# Patient Record
Sex: Female | Born: 1980 | Race: White | Hispanic: No | Marital: Single | Smoking: Never smoker
Health system: Southern US, Community
[De-identification: ages and names within clinical notes are randomized; demographics above are authoritative.]

## PROBLEM LIST (undated history)

## (undated) DIAGNOSIS — R87629 Unspecified abnormal cytological findings in specimens from vagina: Secondary | ICD-10-CM

## (undated) DIAGNOSIS — M1990 Unspecified osteoarthritis, unspecified site: Secondary | ICD-10-CM

## (undated) HISTORY — DX: Unspecified abnormal cytological findings in specimens from vagina: R87.629

---
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
## 2001-03-04 ENCOUNTER — Ambulatory Visit (HOSPITAL_COMMUNITY): Admission: RE | Admit: 2001-03-04 | Discharge: 2001-03-04 | Payer: Self-pay | Admitting: *Deleted

## 2001-06-13 ENCOUNTER — Inpatient Hospital Stay (HOSPITAL_COMMUNITY): Admission: AD | Admit: 2001-06-13 | Discharge: 2001-06-13 | Payer: Self-pay | Admitting: Obstetrics & Gynecology

## 2001-07-04 ENCOUNTER — Inpatient Hospital Stay (HOSPITAL_COMMUNITY): Admission: AD | Admit: 2001-07-04 | Discharge: 2001-07-07 | Payer: Self-pay | Admitting: Obstetrics & Gynecology

## 2001-12-21 ENCOUNTER — Inpatient Hospital Stay (HOSPITAL_COMMUNITY): Admission: AD | Admit: 2001-12-21 | Discharge: 2001-12-21 | Payer: Self-pay | Admitting: Obstetrics & Gynecology

## 2002-02-04 ENCOUNTER — Encounter (INDEPENDENT_AMBULATORY_CARE_PROVIDER_SITE_OTHER): Payer: Self-pay | Admitting: *Deleted

## 2002-02-04 ENCOUNTER — Inpatient Hospital Stay (HOSPITAL_COMMUNITY): Admission: AD | Admit: 2002-02-04 | Discharge: 2002-02-04 | Payer: Self-pay | Admitting: Obstetrics & Gynecology

## 2002-10-01 ENCOUNTER — Inpatient Hospital Stay (HOSPITAL_COMMUNITY): Admission: AD | Admit: 2002-10-01 | Discharge: 2002-10-01 | Payer: Self-pay | Admitting: *Deleted

## 2002-10-27 ENCOUNTER — Ambulatory Visit (HOSPITAL_COMMUNITY): Admission: RE | Admit: 2002-10-27 | Discharge: 2002-10-27 | Payer: Self-pay | Admitting: *Deleted

## 2002-12-28 ENCOUNTER — Encounter: Payer: Self-pay | Admitting: Obstetrics and Gynecology

## 2002-12-28 ENCOUNTER — Inpatient Hospital Stay (HOSPITAL_COMMUNITY): Admission: AD | Admit: 2002-12-28 | Discharge: 2002-12-28 | Payer: Self-pay | Admitting: Obstetrics and Gynecology

## 2003-02-04 ENCOUNTER — Inpatient Hospital Stay (HOSPITAL_COMMUNITY): Admission: AD | Admit: 2003-02-04 | Discharge: 2003-02-05 | Payer: Self-pay | Admitting: Family Medicine

## 2003-03-22 ENCOUNTER — Inpatient Hospital Stay (HOSPITAL_COMMUNITY): Admission: AD | Admit: 2003-03-22 | Discharge: 2003-03-22 | Payer: Self-pay | Admitting: Family Medicine

## 2003-04-04 ENCOUNTER — Inpatient Hospital Stay (HOSPITAL_COMMUNITY): Admission: AD | Admit: 2003-04-04 | Discharge: 2003-04-04 | Payer: Self-pay | Admitting: *Deleted

## 2003-04-06 ENCOUNTER — Encounter (HOSPITAL_COMMUNITY): Admission: RE | Admit: 2003-04-06 | Discharge: 2003-04-09 | Payer: Self-pay | Admitting: *Deleted

## 2003-04-08 ENCOUNTER — Inpatient Hospital Stay (HOSPITAL_COMMUNITY): Admission: AD | Admit: 2003-04-08 | Discharge: 2003-04-08 | Payer: Self-pay | Admitting: Family Medicine

## 2003-04-10 ENCOUNTER — Inpatient Hospital Stay (HOSPITAL_COMMUNITY): Admission: AD | Admit: 2003-04-10 | Discharge: 2003-04-10 | Payer: Self-pay | Admitting: *Deleted

## 2003-04-11 ENCOUNTER — Inpatient Hospital Stay (HOSPITAL_COMMUNITY): Admission: AD | Admit: 2003-04-11 | Discharge: 2003-04-13 | Payer: Self-pay | Admitting: Family Medicine

## 2005-08-28 ENCOUNTER — Emergency Department (HOSPITAL_COMMUNITY): Admission: EM | Admit: 2005-08-28 | Discharge: 2005-08-29 | Payer: Self-pay | Admitting: Emergency Medicine

## 2007-11-20 ENCOUNTER — Emergency Department (HOSPITAL_COMMUNITY): Admission: EM | Admit: 2007-11-20 | Discharge: 2007-11-21 | Payer: Self-pay | Admitting: Emergency Medicine

## 2007-11-21 ENCOUNTER — Emergency Department (HOSPITAL_COMMUNITY): Admission: EM | Admit: 2007-11-21 | Discharge: 2007-11-21 | Payer: Self-pay | Admitting: Emergency Medicine
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
## 2009-06-30 ENCOUNTER — Inpatient Hospital Stay (HOSPITAL_COMMUNITY): Admission: AD | Admit: 2009-06-30 | Discharge: 2009-06-30 | Payer: Self-pay | Admitting: Obstetrics

## 2009-08-02 ENCOUNTER — Ambulatory Visit (HOSPITAL_COMMUNITY): Admission: RE | Admit: 2009-08-02 | Discharge: 2009-08-02 | Payer: Self-pay | Admitting: Obstetrics

## 2009-10-08 ENCOUNTER — Inpatient Hospital Stay (HOSPITAL_COMMUNITY): Admission: AD | Admit: 2009-10-08 | Discharge: 2009-10-09 | Payer: Self-pay | Admitting: Obstetrics & Gynecology

## 2009-12-13 ENCOUNTER — Inpatient Hospital Stay (HOSPITAL_COMMUNITY): Admission: AD | Admit: 2009-12-13 | Discharge: 2009-12-13 | Payer: Self-pay | Admitting: Obstetrics and Gynecology

## 2009-12-18 ENCOUNTER — Inpatient Hospital Stay (HOSPITAL_COMMUNITY): Admission: AD | Admit: 2009-12-18 | Discharge: 2009-12-18 | Payer: Self-pay | Admitting: Obstetrics & Gynecology

## 2010-01-03 ENCOUNTER — Inpatient Hospital Stay (HOSPITAL_COMMUNITY): Admission: AD | Admit: 2010-01-03 | Discharge: 2010-01-06 | Payer: Self-pay | Admitting: Obstetrics & Gynecology

## 2010-02-09 ENCOUNTER — Emergency Department (HOSPITAL_COMMUNITY): Admission: EM | Admit: 2010-02-09 | Discharge: 2010-02-09 | Payer: Self-pay | Admitting: Emergency Medicine

## 2010-07-14 IMAGING — US US OB COMP +14 WK
1 series · 14 of 28 positions shown · non-contrast
Comparison: none

OBSTETRICAL ULTRASOUND:
 This ultrasound exam was performed in the [HOSPITAL] Ultrasound Department.  The OB US report was generated in the AS system, and faxed to the ordering physician.  This report is also available in [REDACTED] PACS.

[Series 1: us ob comp +14 wk · 0.27mm/px · 14 of 48 slices shown]
[im 2/48]
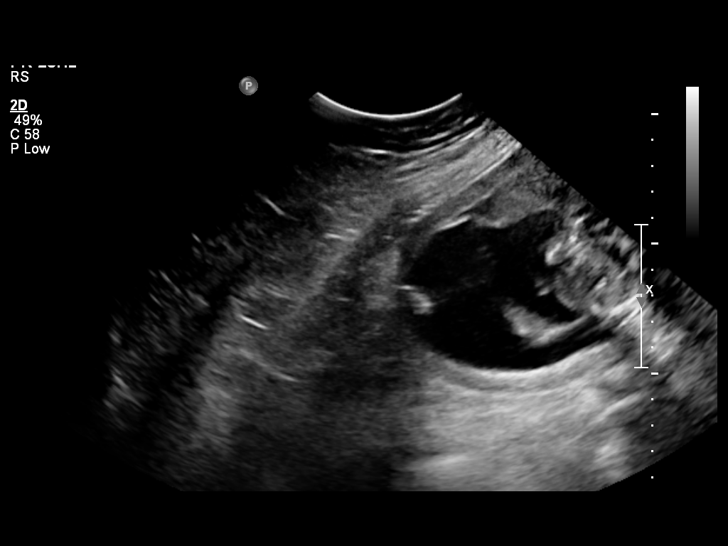
[im 6/48]
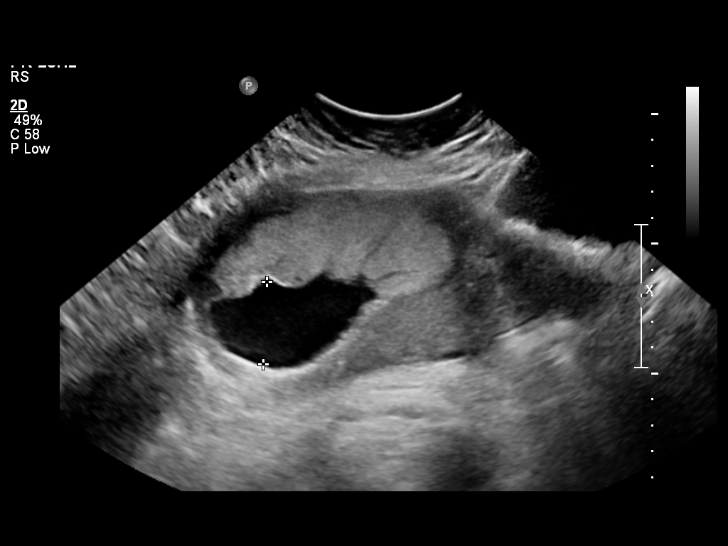
[im 9/48]
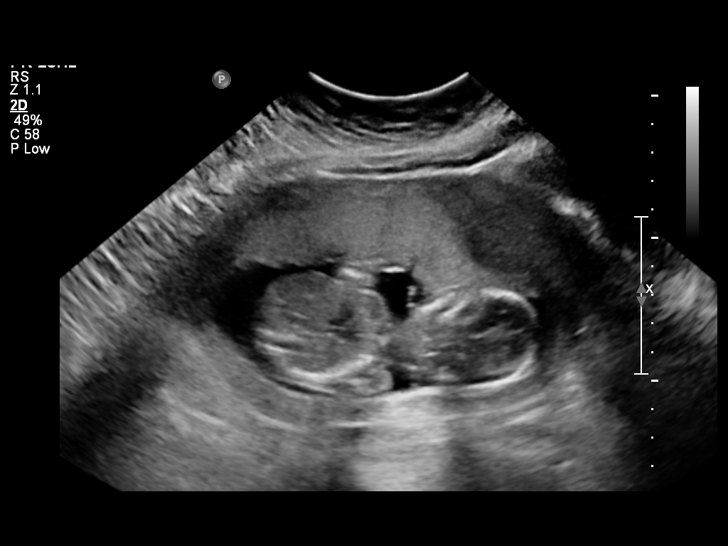
[im 13/48]
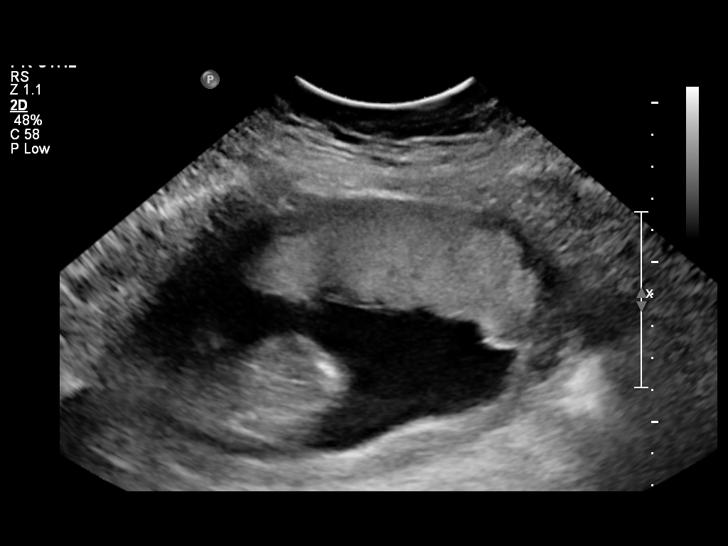
[im 16/48]
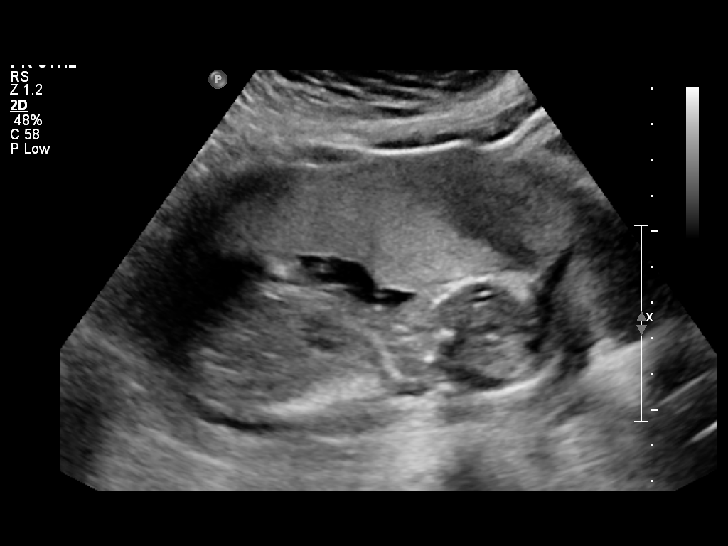
[im 20/48]
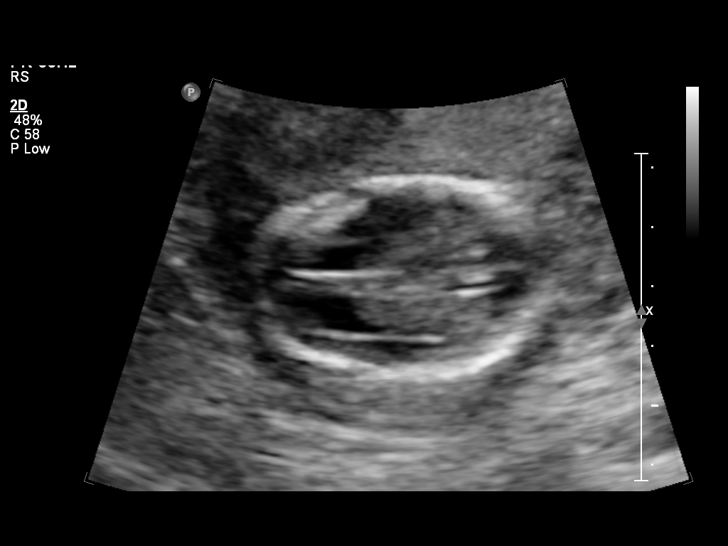
[im 23/48]
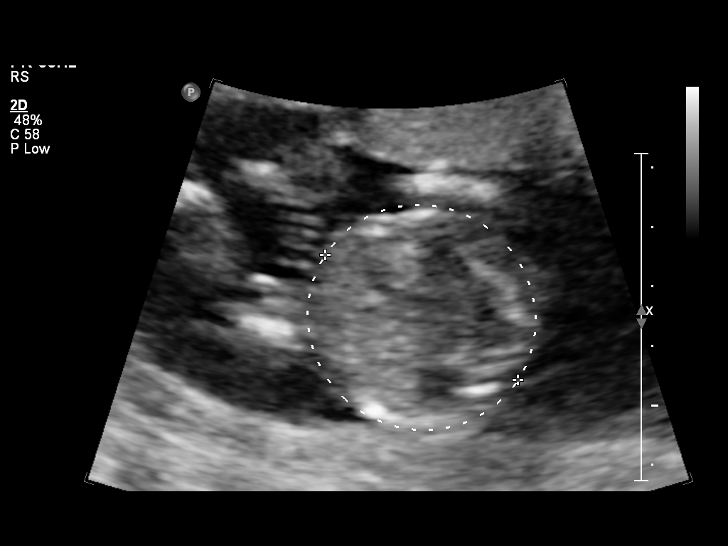
[im 27/48]
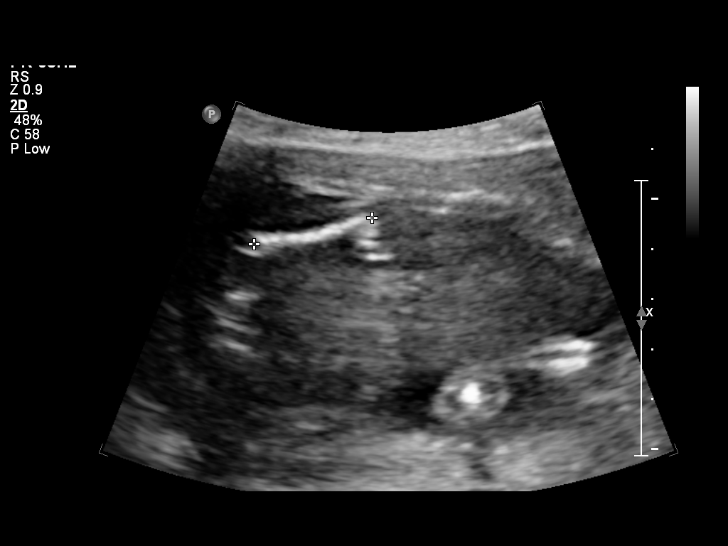
[im 30/48]
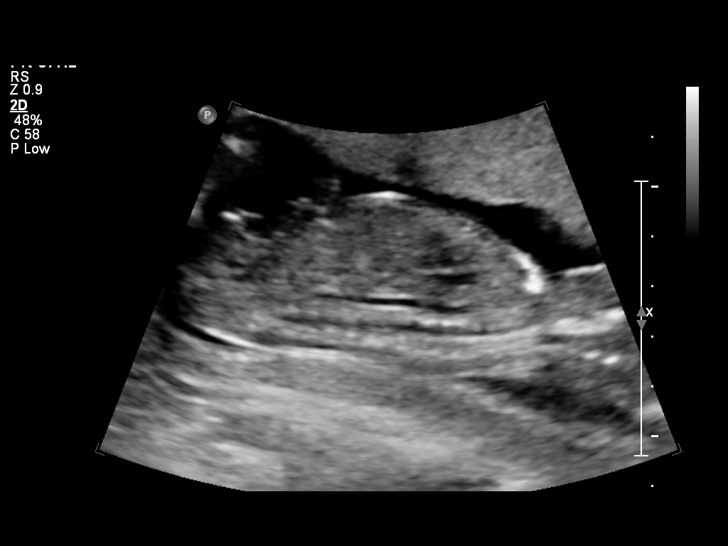
[im 34/48]
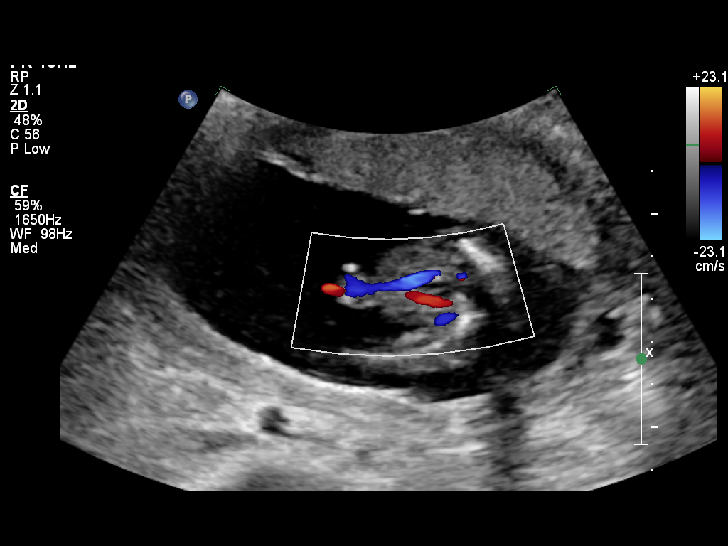
[im 37/48]
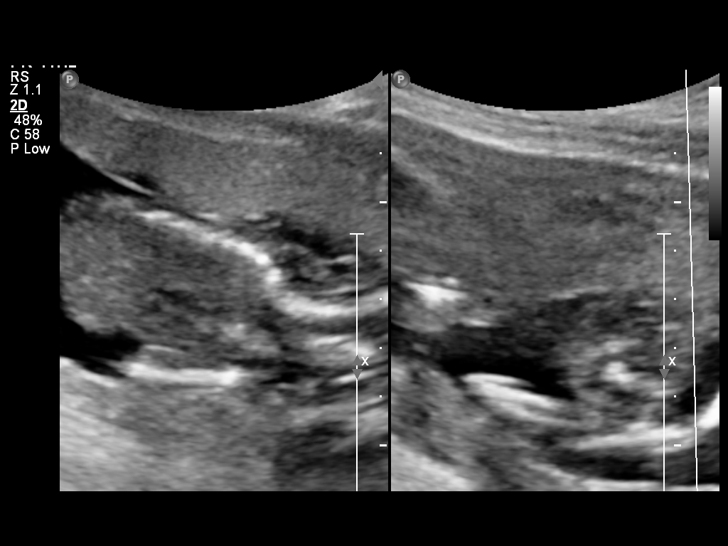
[im 41/48]
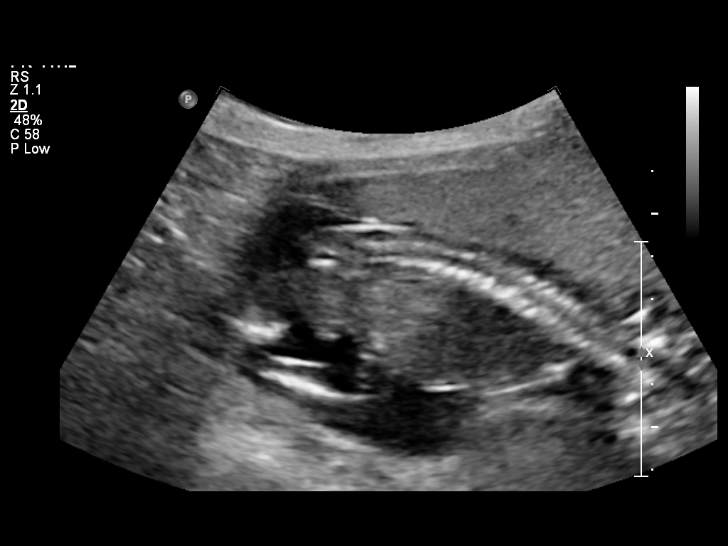
[im 44/48]
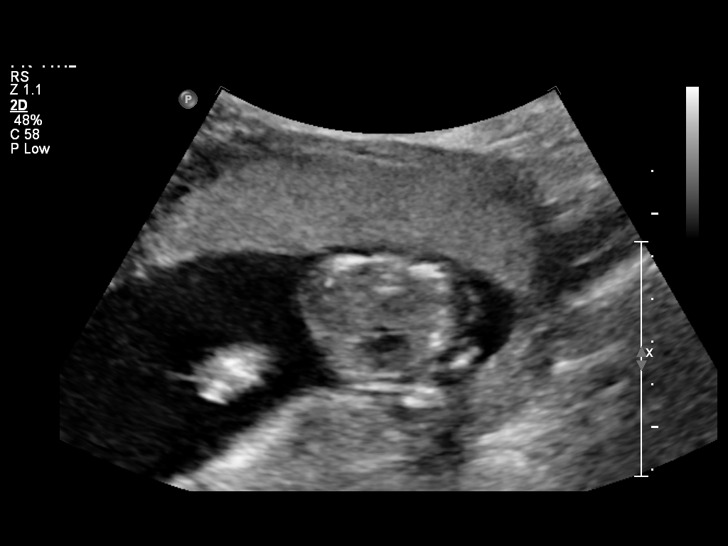
[im 48/48]
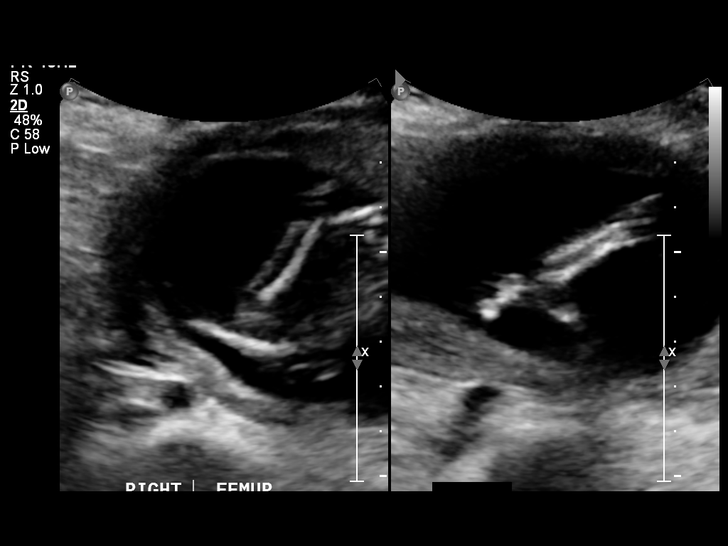

[14 of 28 positions shown; findings below may reference images not displayed]

IMPRESSION: See [facility] Obstetric US report.

## 2011-03-03 LAB — COMPREHENSIVE METABOLIC PANEL
ALT: 22 U/L (ref 0–35)
AST: 35 U/L (ref 0–37)
Albumin: 2.5 g/dL — ABNORMAL LOW (ref 3.5–5.2)
Alkaline Phosphatase: 158 U/L — ABNORMAL HIGH (ref 39–117)
BUN: 8 mg/dL (ref 6–23)
CO2: 22 mEq/L (ref 19–32)
Calcium: 9.3 mg/dL (ref 8.4–10.5)
Chloride: 107 mEq/L (ref 96–112)
Creatinine, Ser: 0.65 mg/dL (ref 0.4–1.2)
GFR calc Af Amer: >60 mL/min (ref >60)
GFR calc non Af Amer: >60 mL/min (ref >60)
Glucose, Bld: 85 mg/dL (ref 70–99)
Potassium: 3.8 mEq/L (ref 3.5–5.1)
Sodium: 135 mEq/L (ref 135–145)
Total Bilirubin: 0.2 mg/dL — ABNORMAL LOW (ref 0.3–1.2)
Total Protein: 5.7 g/dL — ABNORMAL LOW (ref 6.0–8.3)

## 2011-03-03 LAB — URIC ACID: Uric Acid, Serum: 5.6 mg/dL (ref 2.4–7.0)

## 2011-03-03 LAB — CBC
HCT: 34.5 % — ABNORMAL LOW (ref 36.0–46.0)
Hemoglobin: 11.9 g/dL — ABNORMAL LOW (ref 12.0–15.0)
MCHC: 34.4 g/dL (ref 30.0–36.0)
MCV: 91.6 fL (ref 78.0–100.0)
Platelets: 204 10*3/uL (ref 150–400)
RBC: 3.76 MIL/uL — ABNORMAL LOW (ref 3.87–5.11)
RDW: 14.5 % (ref 11.5–15.5)
WBC: 8 10*3/uL (ref 4.0–10.5)

## 2011-03-03 LAB — URINALYSIS, DIPSTICK ONLY
Glucose, UA: NEGATIVE mg/dL
Hgb urine dipstick: NEGATIVE
Leukocytes, UA: NEGATIVE
pH: 6.5 (ref 5.0–8.0)

## 2011-03-03 LAB — LACTATE DEHYDROGENASE: LDH: 174 U/L (ref 94–250)

## 2011-03-04 LAB — LACTATE DEHYDROGENASE
LDH: 208 U/L (ref 94–250)
LDH: 209 U/L (ref 94–250)

## 2011-03-04 LAB — CBC
HCT: 30.4 % — ABNORMAL LOW (ref 36.0–46.0)
HCT: 38.4 % (ref 36.0–46.0)
Hemoglobin: 10.8 g/dL — ABNORMAL LOW (ref 12.0–15.0)
Hemoglobin: 13 g/dL (ref 12.0–15.0)
MCHC: 34 g/dL (ref 30.0–36.0)
MCV: 92 fL (ref 78.0–100.0)
Platelets: 169 10*3/uL (ref 150–400)
Platelets: 171 10*3/uL (ref 150–400)
RDW: 15.3 % (ref 11.5–15.5)
WBC: 11.8 10*3/uL — ABNORMAL HIGH (ref 4.0–10.5)
WBC: 9.7 10*3/uL (ref 4.0–10.5)

## 2011-03-04 LAB — COMPREHENSIVE METABOLIC PANEL
ALT: 24 U/L (ref 0–35)
Albumin: 2 g/dL — ABNORMAL LOW (ref 3.5–5.2)
Alkaline Phosphatase: 222 U/L — ABNORMAL HIGH (ref 39–117)
BUN: 13 mg/dL (ref 6–23)
CO2: 21 mEq/L (ref 19–32)
Calcium: 7.9 mg/dL — ABNORMAL LOW (ref 8.4–10.5)
Chloride: 108 mEq/L (ref 96–112)
Creatinine, Ser: 0.79 mg/dL (ref 0.4–1.2)
GFR calc Af Amer: >60 mL/min (ref >60)
GFR calc non Af Amer: >60 mL/min (ref >60)
Glucose, Bld: 105 mg/dL — ABNORMAL HIGH (ref 70–99)
Glucose, Bld: 76 mg/dL (ref 70–99)
Potassium: 4.3 mEq/L (ref 3.5–5.1)
Sodium: 136 mEq/L (ref 135–145)
Total Bilirubin: 0.5 mg/dL (ref 0.3–1.2)
Total Protein: 5 g/dL — ABNORMAL LOW (ref 6.0–8.3)

## 2011-03-04 LAB — PROTEIN, URINE, RANDOM: Total Protein, Urine: 36 mg/dL

## 2011-03-04 LAB — MAGNESIUM: Magnesium: 6.7 mg/dL (ref 1.5–2.5)

## 2011-03-04 LAB — RPR: RPR Ser Ql: NONREACTIVE

## 2011-03-19 LAB — URIC ACID: Uric Acid, Serum: 5.7 mg/dL (ref 2.4–7.0)

## 2011-03-19 LAB — URINALYSIS, ROUTINE W REFLEX MICROSCOPIC
Leukocytes, UA: NEGATIVE
Nitrite: NEGATIVE
Specific Gravity, Urine: 1.015 (ref 1.005–1.030)
pH: 6.5 (ref 5.0–8.0)

## 2011-03-19 LAB — COMPREHENSIVE METABOLIC PANEL
ALT: 23 U/L (ref 0–35)
Albumin: 2.6 g/dL — ABNORMAL LOW (ref 3.5–5.2)
Alkaline Phosphatase: 165 U/L — ABNORMAL HIGH (ref 39–117)
Potassium: 3.9 mEq/L (ref 3.5–5.1)
Sodium: 135 mEq/L (ref 135–145)
Total Protein: 6.2 g/dL (ref 6.0–8.3)

## 2011-03-19 LAB — URINE MICROSCOPIC-ADD ON

## 2011-03-19 LAB — CBC
Platelets: 230 10*3/uL (ref 150–400)
RDW: 14.2 % (ref 11.5–15.5)

## 2011-05-04 NOTE — Discharge Summary (Signed)
   NAME:  Cindy Galloway, Cindy Galloway                           ACCOUNT NO.:  [id number]   MEDICAL RECORD NO.:  [id number]                   PATIENT TYPE:  INP   LOCATION:  9325                                 FACILITY:  [facility]   PHYSICIAN:  Nani Gasser, M.D.            DATE OF BIRTH:  09/19/1981   DATE OF ADMISSION:  02/04/2003  DATE OF DISCHARGE:  02/05/2003                                 DISCHARGE SUMMARY   DISCHARGE DIAGNOSES:  1. Gastroenteritis.  2. Pregnancy.  3. Dehydration.   DISCHARGE MEDICATIONS:  Phenergan 12.5 mg one p.o. q.6h. p.r.n. #20.   DIET AT HOME:  The patient should begin with soft foods and liquids and  advance diet as tolerated.   FOLLOW-UP:  The patient is to keep her next appointment at [facility]  and was given information on preterm labor.   DISPOSITION:  The patient was discharged to home.   HOSPITAL COURSE:  The patient is a 30 year old G4 P1-0-2-1 who presented at  30 and five-sevenths weeks baseline LMP.  The patient's main complaint was  vomiting and diarrhea since 7 p.m. the previous evening.  She felt dizzy and  complained of cramps; also complained of calf cramps x1 weeks.  She also  felt short of breath today with exertion and has a history of reflux and  recently had a bout of pneumonia during November 2003.  The patient was  admitted for persistent vomiting and dehydration.  The patient was bolused  with IV fluids and put on maintenance. She was given Phenergan for the  nausea.  Her electrolytes were within normal limits:  Sodium 135, potassium  3.7, chloride 106, bicarb 21, BUN 8, creatinine 0.7, and a glucose of 100.  Her urinalysis showed ketones greater than 80 and a specific gravity of  1.030.  The patient did well over several hours and was able to keep down a  modified liquid diet for lunch, and she was discharged to home with  prescription for Phenergan.                                               Nani Gasser, M.D.    CM/MEDQ  D:  02/05/2003  T:  02/05/2003  Job:  [id number]

## 2011-05-04 NOTE — Op Note (Signed)
[facility]  Patient:    Cindy Galloway, Cindy Galloway                        MRN: [id number] Proc. Date: 07/04/01 Adm. Date:  [id number] Disc. Date: [id number] Attending:  Antionette Char                           Operative Report  INCOMPLETE  PREOPERATIVE DIAGNOSES:       1. Term intrauterine pregnancy, breach                                  presentation.                               2. Oligohydramnios.   POSTOPERATIVE DIAGNOSES:      1. Term intrauterine pregnancy, breach                                  presentation.                               2. Oligohydramnios.  OPERATION:                    Primary low transverse cesarean section delivery via Pfannenstiel skin incision.  SURGEON:                      Roseanna Rainbow, M.D.                               Charles A. Clearance Coots, M.D.  ANESTHESIA:                   Spinal.  ESTIMATED BLOOD LOSS:         800 cc.  COMPLICATIONS:                None.  FLUIDS AND URINE OUTPUT:      As per anesthesiology.  INDICATIONS:                  The patient is an 30 year old DD:  07/07/01 TD:  07/08/01 Job: [id number]

## 2011-05-04 NOTE — Discharge Summary (Signed)
[facility]  Patient:    Cindy Galloway, Cindy Galloway                        MRN: [id number] Adm. Date:  [id number] Disc. Date: 07/07/01 Attending:  Antionette Char Dictator:   Jonah Blue, M.D.                           Discharge Summary  DATE OF BIRTH:                [birth year]  ADMISSION DIAGNOSES:          Scheduled for routine cesarean section delivery today due to increased risk with breech presentation and contractions at this time.  DISCHARGE DIAGNOSES:          Status post low transverse cesarean section, status post delivery of a viable female infant.  REFERRING PHYSICIAN:          [facility].  CONSULTS:                     None.  PROCEDURE:                    Low transverse cesarean section on July 04, 2001.  HISTORY AND PHYSICAL:         An 30 year old G2, P0-0-1-0 at 85 and 5 weeks presented with contractions and frank breech presentation.  On admission patient was sent for routine antenatal testing and was frank breech on ultrasound and had been noticing contractions x 1 week, increasing in intensity over the last three days.  Membranes were intact at that time.  No vaginal bleeding.  Positive fetal movement.  [facility] with onset at 93 and 3 weeks.  Pregnancy complications were elevated BPs, breech presentation, and anemia.  MEDICATIONS:                  Prenatal vitamins, iron, Tylenol, Tums.  ALLERGIES:                    No known drug allergies.  PAST OBSTETRICAL HISTORY:     G2, previous TAB at 8 weeks.  PAST MEDICAL HISTORY:         History of GERD, history of kidney stones, TAB at 8 weeks.  PAST GYNECOLOGICAL HISTORY:   None.  FAMILY HISTORY:               Noncontributory.  SOCIAL HISTORY:               Noncontributory.  PRENATAL LABORATORIES:        A+.  Antibody negative.  Hemoglobin 12.4, platelets 339,000.  Rubella immune.  Hepatitis B surface antigen  negative. RPR negative.  HIV negative.  GC and chlamydia negative.  GBS negative on March 31, 2001.  ______ ultrasound at 21 and 5 weeks.  PHYSICAL EXAMINATION  GENERAL:                      Patient in no acute distress.  HEART:                        Regular rate and rhythm.  LUNGS:                        Clear to auscultation  bilaterally.  ABDOMEN:                      Gravid, positive bowel sounds.  EXTREMITIES:                  Pulses 2+.  No edema.  LABORATORIES:                 Fetal heart rate in the 140s with positive reactivity and variability, mild variable decelerations.  Contraction frequency 7-11 minutes lasting approximately 30 seconds.  Ultrasound showed breech presentation with oligohydramnios.  ASSESSMENT:                   An 30 year old G2, P0 at 68 and 5 with frank breech presentation and uterine contractions and oligohydramnios scheduled for elective cesarean section delivery due to increased risk with breech presentation and contractions at this time.  HOSPITAL COURSE:              Patient was taken to the OR on July 04, 2001 at 1600 with preoperative diagnoses of IUP at term, breech presentation, and oligohydramnios.  Postoperative diagnoses the same.  Delivered a live female infant from frank breech presentation with Apgars 8/1 and 9/5.  Delivered at 1632.  Patient did well postoperative.  Began breast-feeding without difficulty.  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  Patients hospital course was normal without complications.  Patients hemoglobin and hematocrit on July 05, 2001 were 10.3 and 29.6.  CONDITION ON DISCHARGE:       Good.  DISPOSITION:                  Patient discharged to home with infant.  DISCHARGE MEDICATIONS:        1. Ibuprofen 600 mg p.o. q.6h. p.r.n. pain.                               2. Percocet 5/325 one tablet p.o. q.6h. p.r.n.                                   severe pain.                               3. Prenatal vitamins one p.o. q.d.  INSTRUCTIONS:                 Routine activity, diet, dressing, and pelvic rest instructions were given to patient.  [facility] DD:  07/07/01 TD:  07/07/01 Job: [id number]

## 2012-04-28 ENCOUNTER — Emergency Department (HOSPITAL_COMMUNITY)
Admission: EM | Admit: 2012-04-28 | Discharge: 2012-04-28 | Disposition: A | Discharge status: Home / Self Care | Payer: Self-pay

## 2012-04-29 ENCOUNTER — Emergency Department (HOSPITAL_COMMUNITY): Payer: No Typology Code available for payment source

## 2012-04-29 ENCOUNTER — Emergency Department (HOSPITAL_COMMUNITY)
Admission: EM | Admit: 2012-04-29 | Discharge: 2012-04-29 | Disposition: A | Discharge status: Home / Self Care | Payer: No Typology Code available for payment source | Attending: Emergency Medicine | Admitting: Emergency Medicine

## 2012-04-29 ENCOUNTER — Encounter (HOSPITAL_COMMUNITY): Payer: Self-pay | Admitting: *Deleted

## 2012-04-29 DIAGNOSIS — Y998 Other external cause status: Secondary | ICD-10-CM | POA: Insufficient documentation

## 2012-04-29 DIAGNOSIS — M546 Pain in thoracic spine: Secondary | ICD-10-CM | POA: Insufficient documentation

## 2012-04-29 DIAGNOSIS — M25519 Pain in unspecified shoulder: Secondary | ICD-10-CM | POA: Insufficient documentation

## 2012-04-29 DIAGNOSIS — T148XXA Other injury of unspecified body region, initial encounter: Secondary | ICD-10-CM

## 2012-04-29 DIAGNOSIS — M25579 Pain in unspecified ankle and joints of unspecified foot: Secondary | ICD-10-CM | POA: Insufficient documentation

## 2012-04-29 DIAGNOSIS — M1990 Unspecified osteoarthritis, unspecified site: Secondary | ICD-10-CM | POA: Insufficient documentation

## 2012-04-29 DIAGNOSIS — M542 Cervicalgia: Secondary | ICD-10-CM | POA: Insufficient documentation

## 2012-04-29 DIAGNOSIS — Y92410 Unspecified street and highway as the place of occurrence of the external cause: Secondary | ICD-10-CM | POA: Insufficient documentation

## 2012-04-29 DIAGNOSIS — M25539 Pain in unspecified wrist: Secondary | ICD-10-CM | POA: Insufficient documentation

## 2012-04-29 HISTORY — DX: Unspecified osteoarthritis, unspecified site: M19.90

## 2012-04-29 MED ORDER — IBUPROFEN 800 MG PO TABS
800.0000 mg | ORAL_TABLET | Freq: Once | ORAL | Status: AC
  Administered 2012-04-29: 800 mg via ORAL
  Filled 2012-04-29: qty 1

## 2012-04-29 MED ORDER — HYDROCODONE-ACETAMINOPHEN 5-325 MG PO TABS: 1.0000 | ORAL_TABLET | ORAL | Status: AC | PRN

## 2012-04-29 MED ORDER — CYCLOBENZAPRINE HCL 10 MG PO TABS: 10.0000 mg | ORAL_TABLET | Freq: Two times a day (BID) | ORAL | Status: AC | PRN

## 2012-04-29 MED ORDER — IBUPROFEN 800 MG PO TABS: 800.0000 mg | ORAL_TABLET | Freq: Three times a day (TID) | ORAL | Status: AC
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
## 2012-04-29 NOTE — ED Notes (Addendum)
Documentation on wrong pt.
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
## 2012-04-30 NOTE — ED Provider Notes (Signed)
Medical screening examination/treatment/procedure(s) were performed by non-physician practitioner and as supervising physician I was immediately available for consultation/collaboration.  Jerilyn Gillaspie R. Chee Dimon, MD 04/30/12 1536 

## 2014-10-06 ENCOUNTER — Encounter (HOSPITAL_COMMUNITY): Payer: Self-pay | Admitting: Emergency Medicine

## 2014-10-06 ENCOUNTER — Emergency Department (HOSPITAL_COMMUNITY)
Admission: EM | Admit: 2014-10-06 | Discharge: 2014-10-06 | Disposition: A | Discharge status: Home / Self Care | Payer: BC Managed Care – PPO | Attending: Emergency Medicine | Admitting: Emergency Medicine

## 2014-10-06 DIAGNOSIS — Z791 Long term (current) use of non-steroidal anti-inflammatories (NSAID): Secondary | ICD-10-CM | POA: Insufficient documentation

## 2014-10-06 DIAGNOSIS — M1990 Unspecified osteoarthritis, unspecified site: Secondary | ICD-10-CM | POA: Insufficient documentation

## 2014-10-06 DIAGNOSIS — B029 Zoster without complications: Secondary | ICD-10-CM

## 2014-10-06 MED ORDER — ACYCLOVIR 800 MG PO TABS: 800.0000 mg | ORAL_TABLET | Freq: Every day | ORAL | Status: DC

## 2014-10-06 MED ORDER — HYDROCODONE-ACETAMINOPHEN 5-325 MG PO TABS: 1.0000 | ORAL_TABLET | ORAL | Status: DC | PRN

## 2014-10-06 MED ORDER — GABAPENTIN 100 MG PO CAPS: 100.0000 mg | ORAL_CAPSULE | Freq: Three times a day (TID) | ORAL | Status: DC

## 2014-10-06 NOTE — ED Notes (Addendum)
Pt c/o burning rash starting in middle of back and coming around to front breast.  C/o burning pain.  Has had chicken pox and shingles before.  None of the sores are open.

## 2014-10-06 NOTE — Discharge Instructions (Signed)

## 2014-10-06 NOTE — ED Provider Notes (Signed)
CSN: [id number]     Arrival date & time 10/06/14  [id number] History   First MD Initiated Contact with Patient 10/06/14 1009     Chief Complaint  Patient presents with  . Rash  . Shingles?      (Consider location/radiation/quality/duration/timing/severity/associated sxs/prior Treatment) HPI Pt is a 33yo female presenting to ED with c/o of a rash that is constant burning itching, gradually worsening, and spreading since Saturday, 10/17.  Rash started on right side of mid-back between her shoulder blades but is now wrapping around under her right arm to her right breast.  Pt states she initially thought it was a spider bite and felt a "numbness" type feeling but it has since turned into a burning sensation, 7/10 at worst. Worse with touch.  Hx of chickenpox and shingles before and believes this may be shingles again, however, states the first time was "many years ago" at that time she was prescribed a cream to apply to rash and it eventually went away.  That rash was on her left shoulder.  Denies fever, chills, n/v/d.     Past Medical History  Diagnosis Date  . DJD (degenerative joint disease)    Past Surgical History  Procedure Laterality Date  . Cesarean section     No family history on file. History  Substance Use Topics  . Smoking status: Never Smoker   . Smokeless tobacco: Not on file  . Alcohol Use: Yes     Comment: occasionally   OB History   Grav Para Term Preterm Abortions TAB SAB Ect Mult Living                 Review of Systems  Constitutional: Negative for fever and chills.  Respiratory: Negative for cough and shortness of breath.   Cardiovascular: Negative for chest pain and palpitations.  Gastrointestinal: Negative for nausea, vomiting and diarrhea.  Musculoskeletal: Positive for myalgias ( along area of rash). Negative for arthralgias.  Skin: Positive for rash and wound. Negative for color change.  All other systems reviewed and are negative.     Allergies   Review of patient's allergies indicates no known allergies.  Home Medications   Prior to Admission medications   Medication Sig Start Date End Date Taking? Authorizing Provider  naproxen sodium (ANAPROX) 220 MG tablet Take 440 mg by mouth 2 (two) times daily with a meal.   Yes Historical Provider, MD  acyclovir (ZOVIRAX) 800 MG tablet Take 1 tablet (800 mg total) by mouth 5 (five) times daily. 10/06/14   Junius FinnerErin O'Malley, PA-C  gabapentin (NEURONTIN) 100 MG capsule Take 1 capsule (100 mg total) by mouth 3 (three) times daily. 10/06/14   Junius FinnerErin O'Malley, PA-C  HYDROcodone-acetaminophen (NORCO/VICODIN) 5-325 MG per tablet Take 1-2 tablets by mouth every 4 (four) hours as needed for moderate pain or severe pain. 10/06/14   Junius FinnerErin O'Malley, PA-C   BP 124/73  Pulse 68  Temp(Src) 97.8 F (36.6 C) (Oral)  Resp 18  SpO2 100% Physical Exam  Nursing note and vitals reviewed. Constitutional: She is oriented to person, place, and time. She appears well-developed and well-nourished.  Pt appears well, non-toxic.  HENT:  Head: Normocephalic and atraumatic.  Eyes: EOM are normal.  Neck: Normal range of motion.  Cardiovascular: Normal rate.   Pulmonary/Chest: Effort normal.  Musculoskeletal: Normal range of motion.  Neurological: She is alert and oriented to person, place, and time.  Skin: Skin is warm and dry. Rash noted. There is erythema.  Rash along T4-T6  dermatome on right side of back and right axilla.  Erythematous papular rash with confluent vesicles. No active discharge. No bleeding. Tender to touch. No evidence of underlying infection  Psychiatric: She has a normal mood and affect. Her behavior is normal.    ED Course  Procedures (including critical care time) Labs Review Labs Reviewed - No data to display  Imaging Review No results found.   EKG Interpretation None      MDM   Final diagnoses:  Shingles rash    Pt presenting to ED with rash consistent with shingles.  Rash does  not have any open sores or signs of underlying infection. Will discharge home with acyclovir, neurontin, and norco.  Advised to f/u with PCP in 1 week for recheck of symptoms if not improving. Home care instructions provided. Return precautions provided. Pt verbalized understanding and agreement with tx plan.      Junius Finnerrin O'Malley, PA-C 10/06/14 1039

## 2014-10-11 NOTE — ED Provider Notes (Signed)
Medical screening examination/treatment/procedure(s) were performed by non-physician practitioner and as supervising physician I was immediately available for consultation/collaboration.   EKG Interpretation None       Kaisyn Millea, MD 10/11/14 1548 

## 2017-02-18 ENCOUNTER — Encounter: Payer: Self-pay | Admitting: General Practice

## 2017-03-04 ENCOUNTER — Encounter: Payer: Self-pay | Admitting: Obstetrics & Gynecology

## 2017-03-04 ENCOUNTER — Ambulatory Visit (INDEPENDENT_AMBULATORY_CARE_PROVIDER_SITE_OTHER): Payer: Medicaid Other | Admitting: Obstetrics & Gynecology

## 2017-03-04 VITALS — BP 119/61 | HR 77 | Ht 60.0 in | Wt 191.0 lb

## 2017-03-04 DIAGNOSIS — Z30430 Encounter for insertion of intrauterine contraceptive device: Secondary | ICD-10-CM

## 2017-03-04 LAB — POCT PREGNANCY, URINE: Preg Test, Ur: NEGATIVE

## 2017-03-04 MED ORDER — PARAGARD INTRAUTERINE COPPER IU IUD: 1.0000 [IU] | INTRAUTERINE_SYSTEM | Freq: Once | INTRAUTERINE | Status: DC

## 2017-03-04 MED ORDER — PARAGARD INTRAUTERINE COPPER IU IUD
1.0000 | INTRAUTERINE_SYSTEM | Freq: Once | INTRAUTERINE | Status: AC
Start: 2017-03-04 — End: 2017-03-04
  Administered 2017-03-04: 1 via INTRAUTERINE

## 2017-03-04 NOTE — Progress Notes (Signed)
    GYNECOLOGY OFFICE PROCEDURE NOTE  [name] is a 36 y.o. [phone] here for contraception management. [facility] had normal annual exam and normal pap smear there last month; awaiting records from them. She was sent here to discuss BTL but upon review of possible surgical risks, benefits, alternatives; she opted for Paragard IUD instead wasLiletta IUD insertion. No GYN concerns.    IUD Insertion Procedure Note Patient identified, informed consent performed, consent signed.   Discussed risks of irregular bleeding, cramping, infection, malpositioning or misplacement of the IUD outside the uterus which may require further procedure such as laparoscopy. Time out was performed.  Urine pregnancy test negative.  Speculum placed in the vagina.  Cervix visualized.  Cleaned with Betadine x 2.  Grasped anteriorly with a single tooth tenaculum.   Paragard IUD placed per manufacturer's recommendations.  Strings trimmed to 3 cm. Tenaculum was removed, good hemostasis noted.  Patient tolerated procedure well.   Patient was given post-procedure instructions.  She was advised to have backup contraception for one week.  Patient was also asked to check IUD strings periodically and follow up in 4 weeks for IUD check.   Jaynie CollinsUGONNA  ANYANWU, MD, FACOG Attending Obstetrician & Gynecologist, [facility], [facility]

## 2017-03-04 NOTE — Patient Instructions (Signed)

## 2017-03-08 ENCOUNTER — Encounter: Payer: Self-pay | Admitting: *Deleted

## 2017-03-31 ENCOUNTER — Emergency Department (HOSPITAL_COMMUNITY)
Admission: EM | Admit: 2017-03-31 | Discharge: 2017-03-31 | Disposition: A | Discharge status: Home / Self Care | Payer: Medicaid Other | Attending: Emergency Medicine | Admitting: Emergency Medicine

## 2017-03-31 ENCOUNTER — Encounter (HOSPITAL_COMMUNITY): Payer: Self-pay | Admitting: Emergency Medicine

## 2017-03-31 DIAGNOSIS — K0889 Other specified disorders of teeth and supporting structures: Secondary | ICD-10-CM | POA: Diagnosis present

## 2017-03-31 MED ORDER — PENICILLIN V POTASSIUM 500 MG PO TABS: 500.0000 mg | ORAL_TABLET | Freq: Four times a day (QID) | ORAL | 0 refills | Status: AC

## 2017-03-31 MED ORDER — NAPROXEN 500 MG PO TABS: 500.0000 mg | ORAL_TABLET | Freq: Two times a day (BID) | ORAL | 0 refills | Status: AC

## 2017-03-31 NOTE — ED Provider Notes (Signed)
[facility]-EMERGENCY DEPT Provider Note   CSN: [id number] Arrival date & time: 03/31/17  1839   By signing my name below, I, Soijett Blue, attest that this documentation has been prepared under the direction and in the presence of Will Bianka Liberati, PA-C Electronically Signed: Soijett Blue, ED Scribe. 03/31/17. 7:59 PM.  History   Chief Complaint Chief Complaint  Patient presents with  . Dental Pain    HPI Cindy Galloway is a 36 y.o. female who presents to the Emergency Department complaining of left lower dental pain onset this morning. Pt reports associated left sided facial pain, and left jaw pain. Pt has tried ibuprofen with no relief of her symptoms. Denies having a dentist at this time, but notes that she has been evaluated by a dentist in the past. Denies fever, drainage, appetite change, trouble swallowing, and any other symptoms. Denies allergies to medications.     The history is provided by the patient. No language interpreter was used.    Past Medical History:  Diagnosis Date  . DJD (degenerative joint disease)   . Vaginal Pap smear, abnormal     There are no active problems to display for this patient.   Past Surgical History:  Procedure Laterality Date  . CESAREAN SECTION      OB History    Gravida Para Term Preterm AB Living   SAB TAB Ectopic Multiple Live Births     2     3       Home Medications    Prior to Admission medications   Medication Sig Start Date End Date Taking? Authorizing Provider  naproxen (NAPROSYN) 500 MG tablet Take 1 tablet (500 mg total) by mouth 2 (two) times daily with a meal. 03/31/17   Everlene Farrier, PA-C  penicillin v potassium (VEETID) 500 MG tablet Take 1 tablet (500 mg total) by mouth 4 (four) times daily. 03/31/17   Everlene Farrier, PA-C    Family History Family History  Problem Relation Age of Onset  . Diabetes Father   . Cirrhosis Father   . Liver cancer Mother     Social History Social History  Substance  Use Topics  . Smoking status: Never Smoker  . Smokeless tobacco: Never Used  . Alcohol use Yes     Comment: occasionally     Allergies   Patient has no known allergies.   Review of Systems Review of Systems  Constitutional: Positive for chills. Negative for appetite change and fever.  HENT: Positive for dental problem (left lower back). Negative for facial swelling, sore throat, trouble swallowing and voice change.        +Left sided facial pain. + Left jaw pain  Eyes: Negative for visual disturbance.  Neurological: Negative for headaches.     Physical Exam Updated Vital Signs BP (!) 142/82   Pulse 96   Temp 98.9 F (37.2 C)   Resp 18   Ht 5' (1.524 m)   Wt 83.9 kg   LMP 03/07/2017   SpO2 98%   BMI 36.13 kg/m   Physical Exam  Constitutional: She is oriented to person, place, and time. She appears well-developed and well-nourished. No distress.  Non-toxic appearing.   HENT:  Head: Normocephalic and atraumatic.  Right Ear: External ear normal.  Left Ear: External ear normal.  Mouth/Throat: Uvula is midline, oropharynx is clear and moist and mucous membranes are normal. No trismus in the jaw. No dental abscesses. No oropharyngeal exudate.  Tenderness to left back molar. No fluctuance, induration or abscess.  No discharge from the mouth. No facial swelling.  Uvula is midline without edema. Soft palate rises symmetrically. No tonsillar hypertrophy or exudates. Tongue protrusion is normal. No trismus. No PTA.  Bilateral tympanic membranes are pearly-gray without erythema or loss of landmarks.   Eyes: Conjunctivae are normal. Pupils are equal, round, and reactive to light. Right eye exhibits no discharge. Left eye exhibits no discharge.  Neck: Normal range of motion. Neck supple. No JVD present. No tracheal deviation present.  Cardiovascular: Normal rate and intact distal pulses.   Pulmonary/Chest: Effort normal. No respiratory distress.  Lymphadenopathy:    She has no  cervical adenopathy.  Neurological: She is alert and oriented to person, place, and time. No cranial nerve deficit. Coordination normal.  Skin: Skin is warm and dry. Capillary refill takes less than 2 seconds. No rash noted. She is not diaphoretic. No erythema. No pallor.  Psychiatric: She has a normal mood and affect. Her behavior is normal.  Nursing note and vitals reviewed.    ED Treatments / Results  DIAGNOSTIC STUDIES: Oxygen Saturation is 98% on RA, nl by my interpretation.    COORDINATION OF CARE: 7:58 PM Discussed treatment plan with pt at bedside which includes penicillin VK Rx, naprosyn Rx and pt agreed to plan.  Procedures Procedures (including critical care time)  Medications Ordered in ED Medications - No data to display   Initial Impression / Assessment and Plan / ED Course  I have reviewed the triage vital signs and the nursing notes.   This is a 36 y.o. female who presents to the Emergency Department complaining of left lower dental pain onset this morning.  Patient with dentalgia.  No abscess requiring immediate incision and drainage.  Exam not concerning for Ludwig's angina or pharyngeal abscess.  Will treat with penicillin V K and naprosyn Rx. Pt instructed to follow-up with dentist.  Dental resource guide provided. Discussed return precautions. I advised the patient to follow-up with their primary care provider this week. I advised the patient to return to the emergency department with new or worsening symptoms or new concerns. The patient verbalized understanding and agreement with plan.    Final Clinical Impressions(s) / ED Diagnoses   Final diagnoses:  Pain, dental    New Prescriptions New Prescriptions   NAPROXEN (NAPROSYN) 500 MG TABLET    Take 1 tablet (500 mg total) by mouth 2 (two) times daily with a meal.   PENICILLIN V POTASSIUM (VEETID) 500 MG TABLET    Take 1 tablet (500 mg total) by mouth 4 (four) times daily.   I personally performed the  services described in this documentation, which was scribed in my presence. The recorded information has been reviewed and is accurate.       Everlene Farrier, PA-C 03/31/17 2003    Azalia Bilis, MD 03/31/17 2006

## 2017-03-31 NOTE — ED Triage Notes (Signed)
Pt c/o left lower toothache that increased when she woke up today. Pt reports that she had a wisdom tooth growing in.

## 2017-04-04 ENCOUNTER — Ambulatory Visit: Payer: Medicaid Other | Admitting: Obstetrics & Gynecology

## 2017-04-04 ENCOUNTER — Encounter: Payer: Self-pay | Admitting: *Deleted

## 2017-04-04 NOTE — Progress Notes (Signed)
Pt did not keep appointment today. Per Dr. Anywanwu no need to call patient, if she calls she may reschedule if desired.  

## 2020-09-12 ENCOUNTER — Telehealth: Payer: Self-pay | Admitting: Genetic Counselor

## 2020-09-12 NOTE — Telephone Encounter (Signed)
Received a genetic counseling referral from [facility] for fhx of breast cancer. Pt has been scheduled for the pt to see Irving Burton on 10/19

## 2020-09-12 NOTE — Telephone Encounter (Signed)
Received a genetic counseling referral from [facility] for fhx of breast cancer. Pt has been scheduled for the pt to see Irving Burton on 10/19 at 1pm. Letter mailed.

## 2020-10-04 ENCOUNTER — Inpatient Hospital Stay: Payer: Medicaid Other

## 2020-10-04 ENCOUNTER — Inpatient Hospital Stay: Payer: Medicaid Other | Admitting: Genetic Counselor

## 2020-10-11 ENCOUNTER — Inpatient Hospital Stay: Payer: Medicaid Other

## 2020-10-11 ENCOUNTER — Inpatient Hospital Stay: Payer: Medicaid Other | Attending: Genetic Counselor | Admitting: Genetic Counselor
